# Patient Record
Sex: Female | Born: 1980 | Hispanic: No | Marital: Married | Smoking: Never smoker
Health system: Southern US, Community
[De-identification: ages and names within clinical notes are randomized; demographics above are authoritative.]

## PROBLEM LIST (undated history)

## (undated) DIAGNOSIS — K219 Gastro-esophageal reflux disease without esophagitis: Secondary | ICD-10-CM

## (undated) HISTORY — PX: TUBAL LIGATION: SHX77

---

## 2007-06-02 ENCOUNTER — Ambulatory Visit: Payer: Self-pay | Admitting: Family Medicine

## 2007-07-11 ENCOUNTER — Encounter: Payer: Self-pay | Admitting: Maternal & Fetal Medicine

## 2007-12-09 ENCOUNTER — Inpatient Hospital Stay: Payer: Self-pay

## 2008-11-01 IMAGING — CR DG CHEST 1V
1 series · 1 of 1 positions shown · non-contrast
Comparison: none

REASON FOR EXAM: Positive PPD
COMMENTS:

PROCEDURE:     DXR - DXR CHEST 1 VIEWAP OR PA  - June 02, 2007 [DATE]
RESULT:     The lungs are hyperinflated. There is no focal infiltrate. The
heart is normal in size. The pulmonary vascularity is not engorged. I do not
see objective evidence of acute or old tuberculous infection.

[view not recorded]
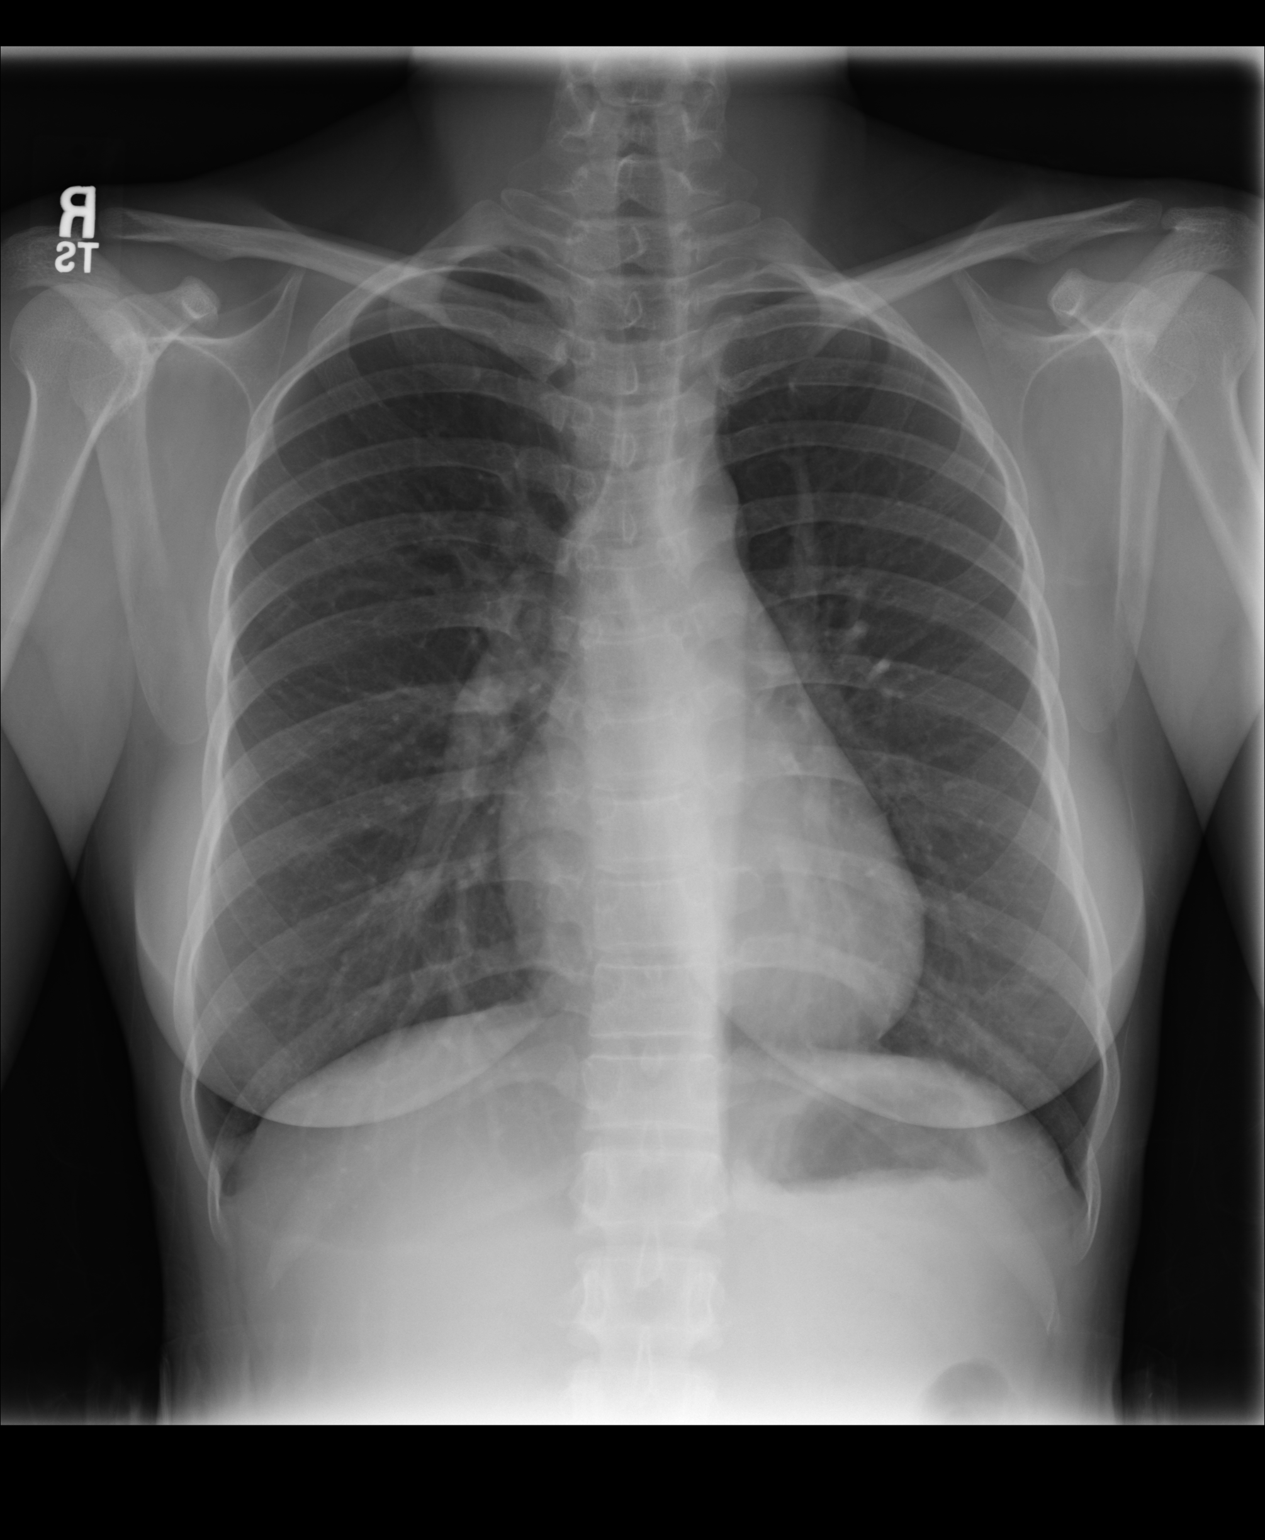

[1 of 1 positions shown; findings below may reference images not displayed]

IMPRESSION: There is mild hyperinflation which may be voluntary or
which may reflect an element of reactive airway disease. I do not see
evidence of acute or old tuberculous infection.

## 2008-12-10 IMAGING — US US OB DETAIL+14 WK - NRPT [facility]
1 series · 14 of 28 positions shown · non-contrast
Comparison: none

[Series 1: us ob detail+14 wk - nrpt [facility] · 0.27mm/px · 14 of 60 slices shown]
[im 3/60]
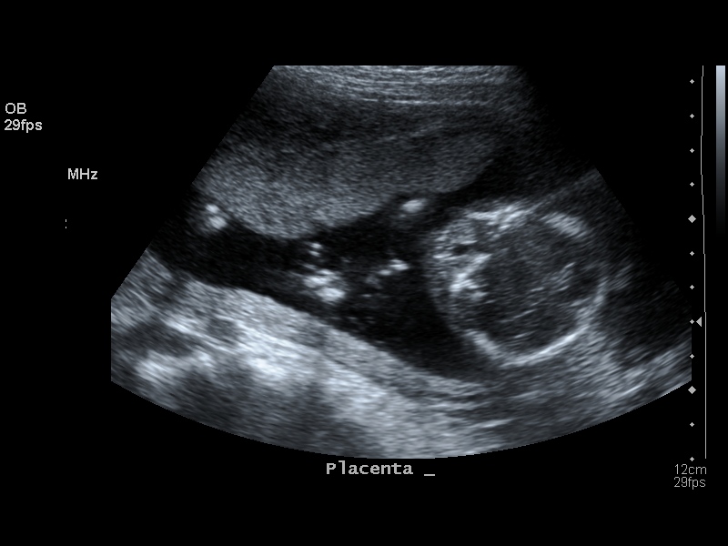
[im 7/60]
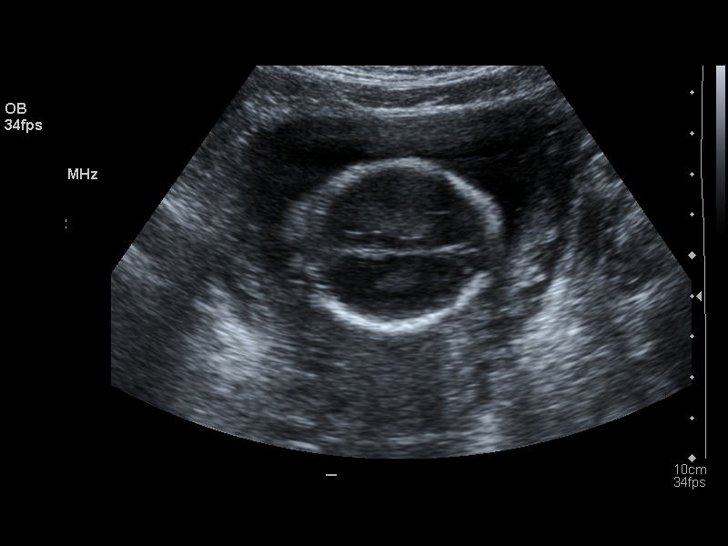
[im 11/60]
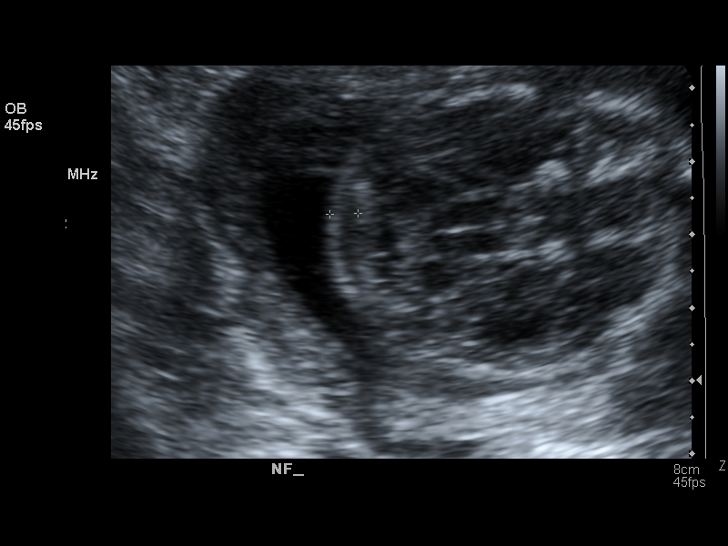
[im 16/60]
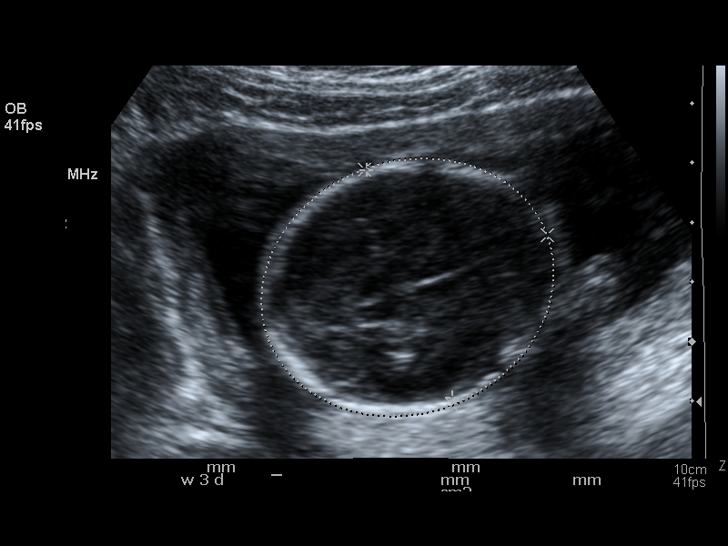
[im 20/60]
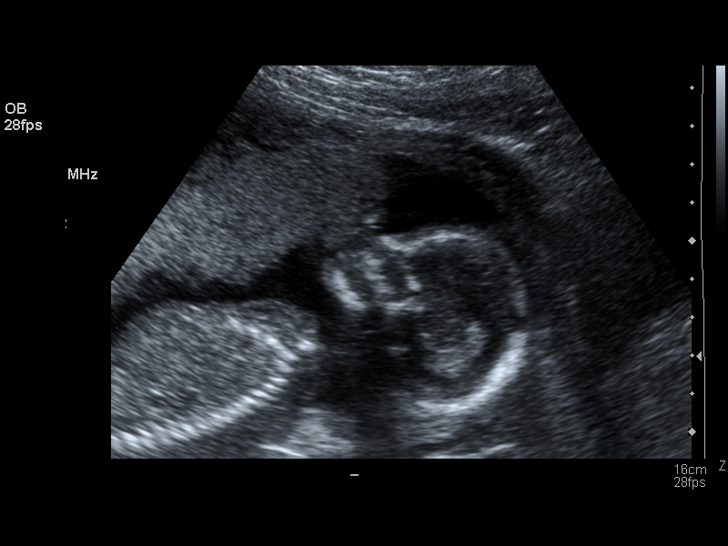
[im 25/60]
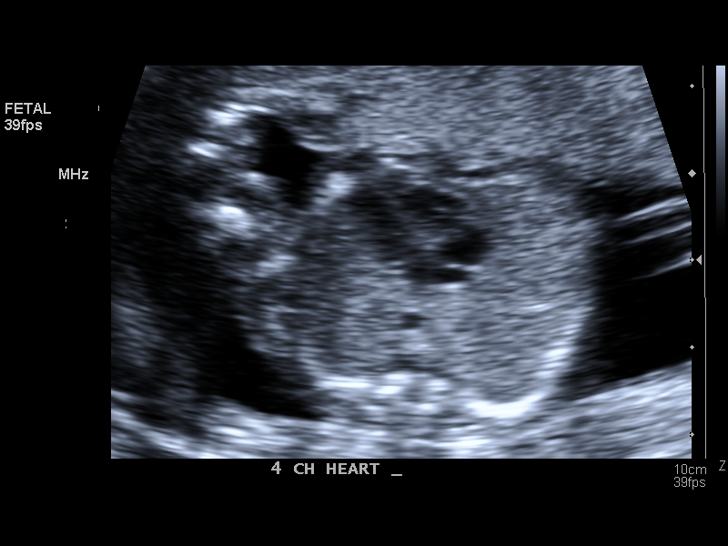
[im 29/60]
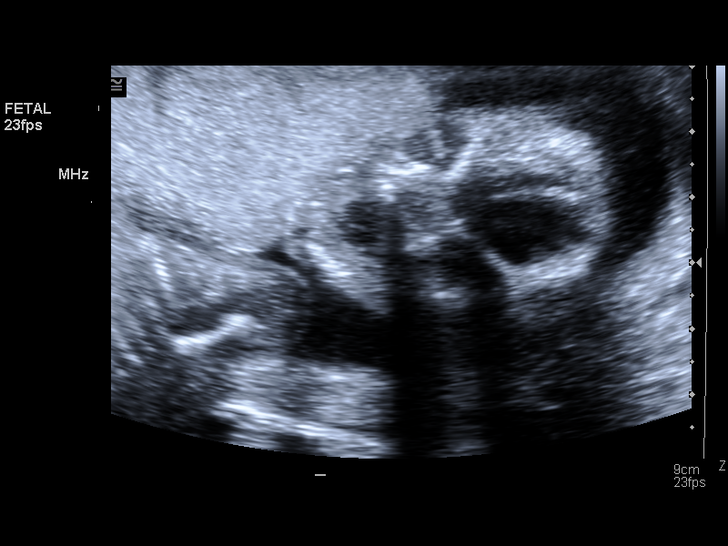
[im 33/60]
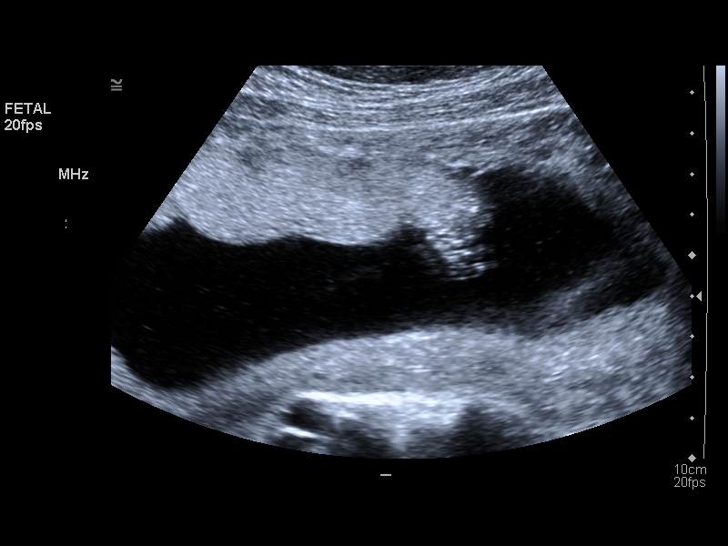
[im 38/60]
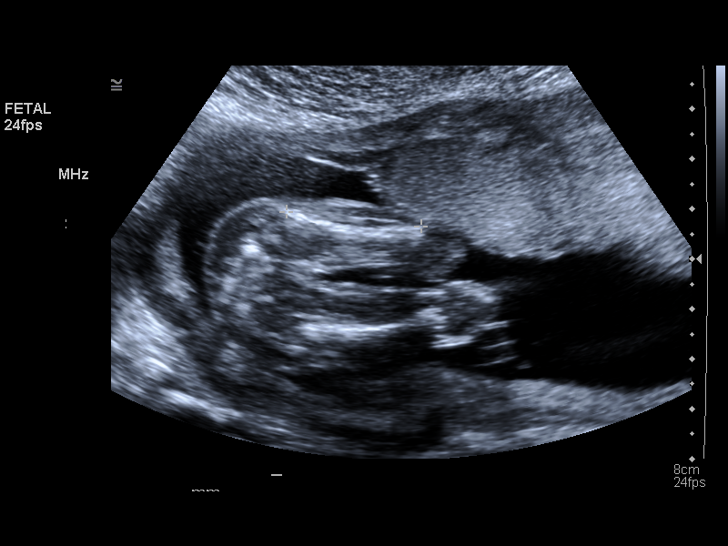
[im 42/60]
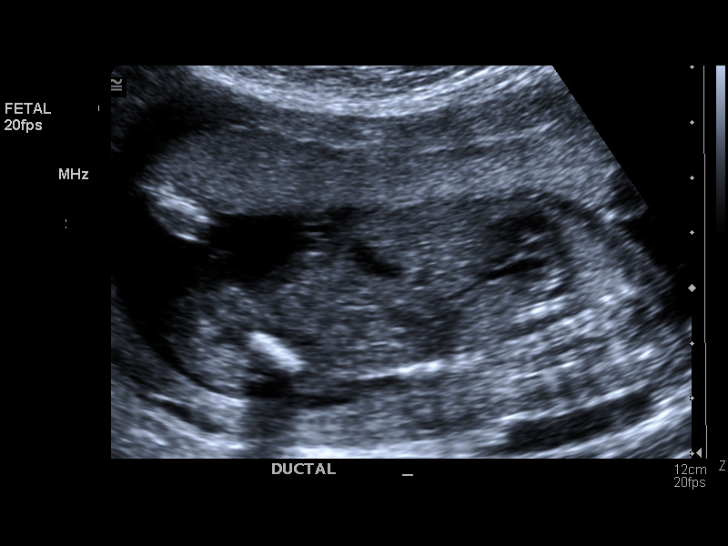
[im 46/60]
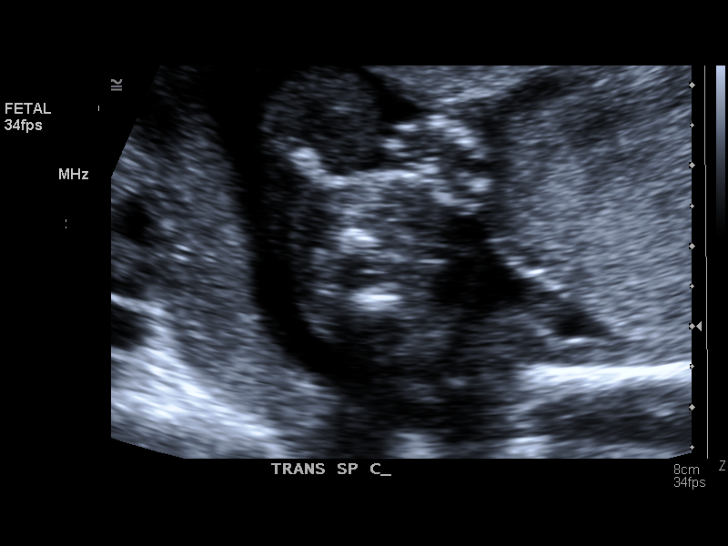
[im 51/60]
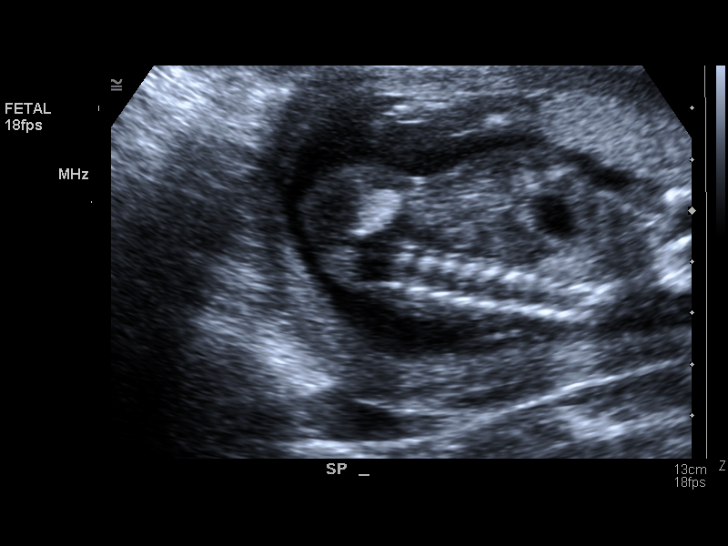
[im 55/60]
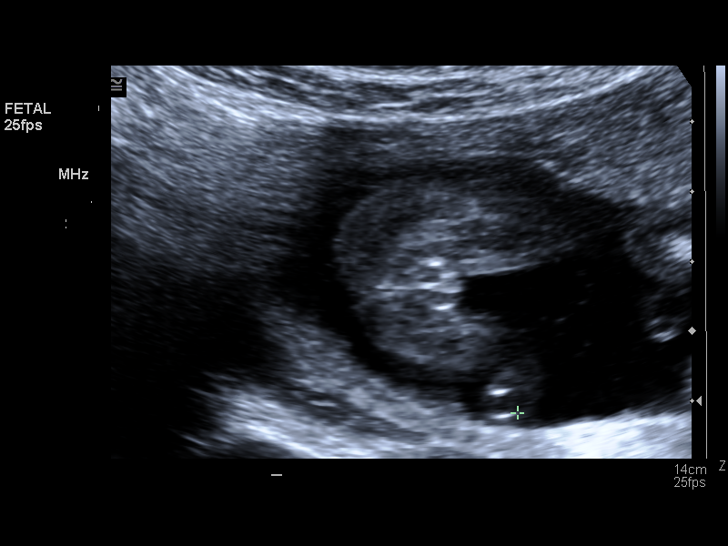
[im 60/60]
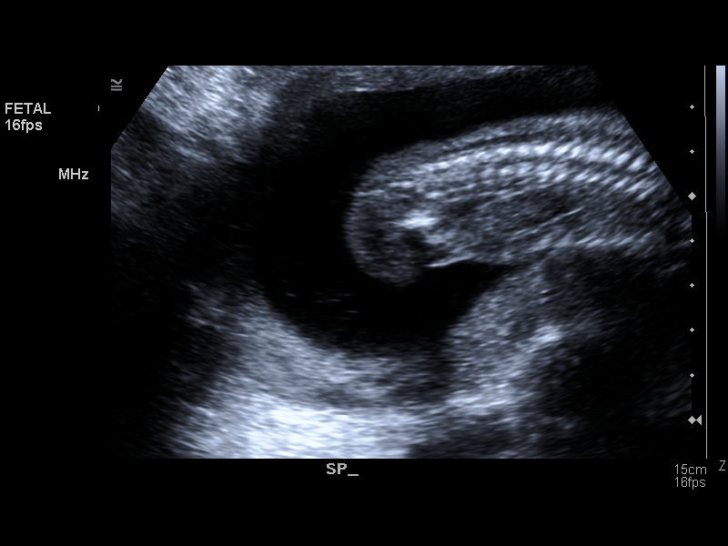

[14 of 28 positions shown; findings below may reference images not displayed]

IMAGES IMPORTED FROM THE [employer] WORKFLOW SYSTEM
NO DICTATION FOR STUDY

## 2013-08-10 ENCOUNTER — Encounter: Payer: Self-pay | Admitting: Maternal & Fetal Medicine

## 2013-09-21 ENCOUNTER — Encounter: Payer: Self-pay | Admitting: Obstetrics & Gynecology

## 2014-02-17 ENCOUNTER — Inpatient Hospital Stay: Payer: Self-pay

## 2014-02-17 LAB — CBC WITH DIFFERENTIAL/PLATELET
BASOS PCT: 0.2 %
Basophil #: 0 10*3/uL (ref 0.0–0.1)
Eosinophil #: 0.2 10*3/uL (ref 0.0–0.7)
Eosinophil %: 1.4 %
HCT: 39.3 % (ref 35.0–47.0)
HGB: 12.7 g/dL (ref 12.0–16.0)
Lymphocyte #: 1.4 10*3/uL (ref 1.0–3.6)
Lymphocyte %: 9.3 %
MCH: 29.8 pg (ref 26.0–34.0)
MCHC: 32.5 g/dL (ref 32.0–36.0)
MCV: 92 fL (ref 80–100)
MONOS PCT: 6.7 %
Monocyte #: 1 x10 3/mm — ABNORMAL HIGH (ref 0.2–0.9)
NEUTROS PCT: 82.4 %
Neutrophil #: 12.7 10*3/uL — ABNORMAL HIGH (ref 1.4–6.5)
Platelet: 117 10*3/uL — ABNORMAL LOW (ref 150–440)
RBC: 4.28 10*6/uL (ref 3.80–5.20)
RDW: 14.4 % (ref 11.5–14.5)
WBC: 15.4 10*3/uL — AB (ref 3.6–11.0)

## 2014-02-18 LAB — HEMATOCRIT: HCT: 33.2 % — ABNORMAL LOW (ref 35.0–47.0)

## 2014-02-18 LAB — CBC WITH DIFFERENTIAL/PLATELET
BASOS PCT: 0.3 %
Basophil #: 0.1 10*3/uL (ref 0.0–0.1)
Eosinophil #: 0 10*3/uL (ref 0.0–0.7)
Eosinophil %: 0.2 %
HCT: 24.3 % — AB (ref 35.0–47.0)
HGB: 7.8 g/dL — ABNORMAL LOW (ref 12.0–16.0)
Lymphocyte #: 1.4 10*3/uL (ref 1.0–3.6)
Lymphocyte %: 6.9 %
MCH: 29.6 pg (ref 26.0–34.0)
MCHC: 32.2 g/dL (ref 32.0–36.0)
MCV: 92 fL (ref 80–100)
MONOS PCT: 4.3 %
Monocyte #: 0.9 x10 3/mm (ref 0.2–0.9)
Neutrophil #: 18 10*3/uL — ABNORMAL HIGH (ref 1.4–6.5)
Neutrophil %: 88.3 %
Platelet: 195 10*3/uL (ref 150–440)
RBC: 2.64 10*6/uL — ABNORMAL LOW (ref 3.80–5.20)
RDW: 14.5 % (ref 11.5–14.5)
WBC: 20.4 10*3/uL — AB (ref 3.6–11.0)

## 2014-02-19 LAB — CBC WITH DIFFERENTIAL/PLATELET
Basophil #: 0 10*3/uL (ref 0.0–0.1)
Basophil %: 0.1 %
Eosinophil #: 0 10*3/uL (ref 0.0–0.7)
Eosinophil %: 0 %
HCT: 22.8 % — AB (ref 35.0–47.0)
HGB: 7.6 g/dL — AB (ref 12.0–16.0)
LYMPHS ABS: 1.7 10*3/uL (ref 1.0–3.6)
LYMPHS PCT: 9.5 %
MCH: 29.7 pg (ref 26.0–34.0)
MCHC: 33.3 g/dL (ref 32.0–36.0)
MCV: 89 fL (ref 80–100)
MONO ABS: 1.5 x10 3/mm — AB (ref 0.2–0.9)
MONOS PCT: 8.3 %
NEUTROS ABS: 14.8 10*3/uL — AB (ref 1.4–6.5)
Neutrophil %: 82.1 %
PLATELETS: 148 10*3/uL — AB (ref 150–440)
RBC: 2.55 10*6/uL — AB (ref 3.80–5.20)
RDW: 14.6 % — ABNORMAL HIGH (ref 11.5–14.5)
WBC: 18.1 10*3/uL — ABNORMAL HIGH (ref 3.6–11.0)

## 2014-02-19 LAB — HEMATOCRIT: HCT: 23.8 % — ABNORMAL LOW (ref 35.0–47.0)

## 2014-02-20 LAB — CBC WITH DIFFERENTIAL/PLATELET
Basophil #: 0 10*3/uL (ref 0.0–0.1)
Basophil %: 0.3 %
Eosinophil #: 0.3 10*3/uL (ref 0.0–0.7)
Eosinophil %: 2.4 %
HCT: 19.4 % — ABNORMAL LOW (ref 35.0–47.0)
HGB: 6.5 g/dL — ABNORMAL LOW (ref 12.0–16.0)
LYMPHS PCT: 11.2 %
Lymphocyte #: 1.4 10*3/uL (ref 1.0–3.6)
MCH: 30.2 pg (ref 26.0–34.0)
MCHC: 33.3 g/dL (ref 32.0–36.0)
MCV: 91 fL (ref 80–100)
MONOS PCT: 9.5 %
Monocyte #: 1.1 x10 3/mm — ABNORMAL HIGH (ref 0.2–0.9)
Neutrophil #: 9.2 10*3/uL — ABNORMAL HIGH (ref 1.4–6.5)
Neutrophil %: 76.6 %
Platelet: 122 10*3/uL — ABNORMAL LOW (ref 150–440)
RBC: 2.14 10*6/uL — ABNORMAL LOW (ref 3.80–5.20)
RDW: 14.7 % — ABNORMAL HIGH (ref 11.5–14.5)
WBC: 12 10*3/uL — AB (ref 3.6–11.0)

## 2014-02-20 LAB — HEMATOCRIT: HCT: 25.9 % — AB (ref 35.0–47.0)

## 2014-02-21 LAB — CBC WITH DIFFERENTIAL/PLATELET
BASOS PCT: 0.4 %
Basophil #: 0 10*3/uL (ref 0.0–0.1)
Basophil #: 0 10*3/uL (ref 0.0–0.1)
Basophil %: 0.2 %
EOS PCT: 5.6 %
Eosinophil #: 0.5 10*3/uL (ref 0.0–0.7)
Eosinophil #: 0.7 10*3/uL (ref 0.0–0.7)
Eosinophil %: 6.3 %
HCT: 23.9 % — ABNORMAL LOW (ref 35.0–47.0)
HCT: 25.1 % — AB (ref 35.0–47.0)
HGB: 8.1 g/dL — ABNORMAL LOW (ref 12.0–16.0)
HGB: 8.2 g/dL — AB (ref 12.0–16.0)
Lymphocyte #: 1.5 10*3/uL (ref 1.0–3.6)
Lymphocyte #: 2.1 10*3/uL (ref 1.0–3.6)
Lymphocyte %: 15.6 %
Lymphocyte %: 19.8 %
MCH: 29.4 pg (ref 26.0–34.0)
MCH: 28.7 pg (ref 26.0–34.0)
MCHC: 33.7 g/dL (ref 32.0–36.0)
MCHC: 32.6 g/dL (ref 32.0–36.0)
MCV: 87 fL (ref 80–100)
MCV: 88 fL (ref 80–100)
MONO ABS: 0.6 x10 3/mm (ref 0.2–0.9)
MONOS PCT: 6.3 %
Monocyte #: 0.9 x10 3/mm (ref 0.2–0.9)
Monocyte %: 8 %
NEUTROS ABS: 7 10*3/uL — AB (ref 1.4–6.5)
Neutrophil #: 6.9 10*3/uL — ABNORMAL HIGH (ref 1.4–6.5)
Neutrophil %: 72.3 %
Neutrophil %: 65.5 %
PLATELETS: 145 10*3/uL — AB (ref 150–440)
Platelet: 124 10*3/uL — ABNORMAL LOW (ref 150–440)
RBC: 2.74 10*6/uL — ABNORMAL LOW (ref 3.80–5.20)
RBC: 2.84 10*6/uL — ABNORMAL LOW (ref 3.80–5.20)
RDW: 18.5 % — AB (ref 11.5–14.5)
RDW: 19.2 % — ABNORMAL HIGH (ref 11.5–14.5)
WBC: 9.5 10*3/uL (ref 3.6–11.0)
WBC: 10.7 10*3/uL (ref 3.6–11.0)

## 2014-02-21 LAB — PATHOLOGY REPORT

## 2014-02-22 LAB — CBC WITH DIFFERENTIAL/PLATELET
Basophil #: 0 10*3/uL (ref 0.0–0.1)
Basophil %: 0.4 %
EOS ABS: 0.6 10*3/uL (ref 0.0–0.7)
Eosinophil %: 7.5 %
HCT: 24.7 % — ABNORMAL LOW (ref 35.0–47.0)
HGB: 8 g/dL — ABNORMAL LOW (ref 12.0–16.0)
Lymphocyte #: 1.7 10*3/uL (ref 1.0–3.6)
Lymphocyte %: 20.6 %
MCH: 28.7 pg (ref 26.0–34.0)
MCHC: 32.4 g/dL (ref 32.0–36.0)
MCV: 89 fL (ref 80–100)
Monocyte #: 0.7 x10 3/mm (ref 0.2–0.9)
Monocyte %: 8.4 %
NEUTROS ABS: 5.3 10*3/uL (ref 1.4–6.5)
Neutrophil %: 63.1 %
Platelet: 131 10*3/uL — ABNORMAL LOW (ref 150–440)
RBC: 2.79 10*6/uL — AB (ref 3.80–5.20)
RDW: 18.5 % — ABNORMAL HIGH (ref 11.5–14.5)
WBC: 8.3 10*3/uL (ref 3.6–11.0)

## 2014-09-08 NOTE — Op Note (Signed)
PATIENT NAME:  Alicia Erickson, Alicia Erickson MR#:  [id number] DATE OF BIRTH:  1981-04-20  DATE OF PROCEDURE:  02/18/2014  PREOPERATIVE DIAGNOSIS: Desire for permanent sterility.  POSTOPERATIVE DIAGNOSIS: Desire for permanent sterility.    PROCEDURE: Postpartum bilateral tubal ligation.   SURGEON: Glean Salen, M.D.  ANESTHESIA: General.   ESTIMATED BLOOD LOSS: Minimal.   COMPLICATIONS: None.   FINDINGS: Normal fallopian tubes on a post gravid uterus.   DISPOSITION: To the recovery room in stable condition.   TECHNIQUE: The patient was prepped and draped in the usual sterile fashion after adequate anesthesia was obtained in the supine position on the operating room table. Marcaine half percent was used to anesthetize the skin just inferior to the umbilicus followed by a small skin incision using a scalpel. The rectus fascia was identified, tented anteriorly and then incised using Metzenbaum scissors. There were no palpable adhesions or bowel in this vicinity. Retractors were placed and the patient was placed in Trendelenburg positioning.   The right fallopian tube was identified and grasped with a clamp once the  fimbria was visualized and then a midportion of the tube was tied with 2 Vicryl sutures, excised and cauterized. The left fallopian tube was grasped in the midportion with Balfour clamp after the fimbria was visualized and a loop was tied with 2 Vicryl sutures, excised and cauterized.  Specimen was sent to pathology for further review.   The patient was leveled.  The rectus fascia was closed with a 0-Vicryl suture. Subcutaneous tissues were irrigated and hemostasis was assured with electrocautery. Subcutaneous suture was placed to approximate the tissues and Dermabond was used to close the skin. The patient went to the recovery room in stable condition. All sponge, instrument and needle counts were correct.    ____________________________ R. Barnett Applebaum, MD rph:hh D: 02/18/2014 16:20:00  ET T: 02/18/2014 20:57:16 ET JOB#: [id number]  cc: Glean Salen, MD, <Dictator> Gae Dry MD ELECTRONICALLY SIGNED 02/19/2014 7:56

## 2014-09-08 NOTE — Op Note (Signed)
PATIENT NAME:  Alicia Erickson, GAUSE MR#:  [id number] DATE OF BIRTH:  Nov 16, 1980  DATE OF PROCEDURE:  02/18/2014  PREOPERATIVE DIAGNOSIS:  Postoperative bleeding.   POSTOPERATIVE DIAGNOSIS: Postoperative bleeding.   PROCEDURE: Exploratory laparotomy.   SURGEON: Glean Salen, M.D.   ANESTHESIA: General.   ESTIMATED BLOOD LOSS: 2400 mL of blood clot and blood were aspirated.   COMPLICATIONS: None.   FINDINGS: Tubal sites were both clean, dry, and intact without bleeding in these areas. Bleeding was visualized and determined to be underneath the umbilicus at the peritoneal and/or fascial edges. No other bleeding was visualized throughout small bowel, colon, omentum, uterus, side walls and liver.  DISPOSITION: To the recovery room in stable condition.   TECHNIQUE: The patient was prepped and draped in the usual sterile fashion after adequate anesthesia is obtained in the supine position on the operating room table. Foley catheter has been placed. Scalpel was used to create a midline vertical incision beneath the umbilicus not including the prior scar in this area. The incision was carried down to the level of the rectus fascia, which was then dissected superiorly and inferiorly and the rectus muscle separated in the midline. Peritoneum was penetrated and exploration was performed. Aspiration of blood clot and blood is performed, which takes several minutes due to several blood clots in her abdominal cavity. Once the blood was aspirated dural exploration of the abdominal cavity and pelvic cavity was performed. The tubal sites, in particular were examined closely with no bleeding noted from these areas. The uterus was appropriately sized postpartum without any other signs of bleeding in other areas around the uterus. The small bowel, colon, liver and other structures are visualized as non-bleeding.  At looking at the peritoneum underneath the umbilicus there is bleeding in this area and 4 sutures were  placed to oversew this area where bleeding was and could still be occurring. At this time the patient was observed for any further bleeding in any parts of the intra-abdominal cavity and it appears to be dry and hemostatic in all areas without any further pooling or welling of blood.  Examination of where we put the sutures reveals no complications in this area and no further bleeding.   The rectus fascia was closed with a 0 PDS suture. Subcutaneous tissues were irrigated and hemostasis was assured using electrocautery. Skin was closed with surgical clips. The patient goes to the recovery room in stable condition. All sponge, instrument, and needle counts were correct at the conclusion of the case. Bandage was applied to the incision before going to the recovery room.    ____________________________ R. Barnett Applebaum, MD rph:hh D: 02/18/2014 37:48:27 ET T: 02/18/2014 23:25:21 ET JOB#: [id number]  cc: Glean Salen, MD, <Dictator> Gae Dry MD ELECTRONICALLY SIGNED 02/19/2014 7:55

## 2014-09-25 NOTE — H&P (Signed)
L&D Evaluation:  History:  HPI 34 y/o G3P2002 @ 41wks [place] 02/10/14 arrived for scheduled IOL in active labor. Regular contractions, denies leaking fluid, nl show, baby active. Care @ [facility] AMA GBS+   Presents with contractions   Patient's Medical History No Chronic Illness   Patient's Surgical History none   Medications Pre Natal Vitamins   Allergies NKDA   Social History none   Family History Non-Contributory   ROS:  ROS All systems were reviewed.  HEENT, CNS, GI, GU, Respiratory, CV, Renal and Musculoskeletal systems were found to be normal.   Exam:  Vital Signs stable   Urine Protein not completed   General no apparent distress   Mental Status clear   Chest clear   Heart normal sinus rhythm   Abdomen gravid, non-tender   Estimated Fetal Weight Average for gestational age   Fetal Position vtx   Fundal Height term   Back no CVAT   Edema no edema   Reflexes 1+   Clonus negative   Pelvic no external lesions, 6cm upon admission rapid progress to complete   Mebranes Ruptured   Description green/meconium   FHT normal rate with no decels, baseline 130's 140's avg variability with accels   FHT Description 136   Ucx regular   Ucx Frequency 2 min   Skin dry   Lymph no lymphadenopathy   Impression:  Impression active labor   Plan:  Plan EFM/NST, monitor contractions and for cervical change, antibiotics for GBBS prophylaxis   Comments Admitted, knows what to expect 3rd baby. Husband at bedside, supportive. IV ABX begun. NEO notified moderate MSAF.   Electronic Signatures: Rosie Fate (CNM)  (Signed 03-Oct-15 09:39)  Authored: L&D Evaluation   Last Updated: 03-Oct-15 09:39 by Rosie Fate (CNM)

## 2016-09-16 ENCOUNTER — Telehealth: Payer: Self-pay | Admitting: *Deleted

## 2016-09-16 ENCOUNTER — Encounter: Payer: Self-pay | Admitting: *Deleted

## 2016-09-16 NOTE — Telephone Encounter (Signed)
Left message for patient to call the office to schedule an appointment. Referred by Dr. Leonides Schanz re: Lipoma on  Back. Notes in folder.

## 2016-09-21 ENCOUNTER — Encounter: Payer: Self-pay | Admitting: *Deleted

## 2016-09-24 ENCOUNTER — Ambulatory Visit (INDEPENDENT_AMBULATORY_CARE_PROVIDER_SITE_OTHER): Payer: 59 | Admitting: General Surgery

## 2016-09-24 ENCOUNTER — Encounter: Payer: Self-pay | Admitting: General Surgery

## 2016-09-24 VITALS — BP 110/68 | HR 72 | Resp 12 | Ht 60.0 in | Wt 109.0 lb

## 2016-09-24 DIAGNOSIS — D171 Benign lipomatous neoplasm of skin and subcutaneous tissue of trunk: Secondary | ICD-10-CM | POA: Diagnosis not present

## 2016-09-24 NOTE — Progress Notes (Signed)
Patient ID: Alicia Erickson, female   DOB: 31-Aug-1980, 36 y.o.   MRN: [id number]  Chief Complaint  Patient presents with  . Lipoma    HPI [name] is a 36 y.o. female here today for a evaluation of a lipoma on her back. Patient states she noticed this area about 13 years ago. She states the area is getting bigger and is sometimes painful.  HPI  No past medical history on file.  Past Surgical History:  Procedure Laterality Date  . TUBAL LIGATION      No family history on file.  Social History Social History  Substance Use Topics  . Smoking status: Never Smoker  . Smokeless tobacco: Never Used  . Alcohol use No    No Known Allergies  No current outpatient prescriptions on file.   No current facility-administered medications for this visit.     Review of Systems Review of Systems  Constitutional: Negative.   Respiratory: Negative.   Cardiovascular: Negative.     Blood pressure 110/68, pulse 72, resp. rate 12, height 5' (1.524 m), weight 109 lb (49.4 kg), last menstrual period 09/04/2016.  Physical Exam Physical Exam  Constitutional: She is oriented to person, place, and time. She appears well-developed and well-nourished.  Eyes: Conjunctivae are normal. No scleral icterus.  Neck: Neck supple.  Cardiovascular: Normal rate, regular rhythm and normal heart sounds.   Pulmonary/Chest: Effort normal and breath sounds normal.  Lymphadenopathy:    She has no cervical adenopathy.  Neurological: She is alert and oriented to person, place, and time.  Skin: Skin is warm and dry.       Data Reviewed History reviewed   Assessment    5cm soft, mobile, subcutaneous mass on left lower back, consistent with lipoma. Considering size and location of the mass, may be more appropriate to have it excised in the OR. Discussed details of procedure with the patient and she was agreeable.    Plan    Patient to be scheduled for excision lipoma lower back.      HPI,  Physical Exam, Assessment and Plan have been scribed under the direction and in the presence of Mckinley Jewel, MD  Gaspar Cola, CMA  I have completed the exam and reviewed the above documentation for accuracy and completeness.  I agree with the above.  Haematologist has been used and any errors in dictation or transcription are unintentional.  Judyann Casasola G. Jamal Collin, M.D., F.A.C.S.  Junie Panning G 09/24/2016, 9:56 AM   Patient's surgery has been scheduled for 10-01-16 at [facility].   Dominga Ferry, CMA

## 2016-09-24 NOTE — Patient Instructions (Signed)
Lipoma A lipoma is a noncancerous (benign) tumor that is made up of fat cells. This is a very common type of soft-tissue growth. Lipomas are usually found under the skin (subcutaneous). They may occur in any tissue of the body that contains fat. Common areas for lipomas to appear include the back, shoulders, buttocks, and thighs. Lipomas grow slowly, and they are usually painless. Most lipomas do not cause problems and do not require treatment. What are the causes? The cause of this condition is not known. What increases the risk? This condition is more likely to develop in:  People who are 80-39 years old.  People who have a family history of lipomas. What are the signs or symptoms? A lipoma usually appears as a small, round bump under the skin. It may feel soft or rubbery, but the firmness can vary. Most lipomas are not painful. However, a lipoma may become painful if it is located in an area where it pushes on nerves. How is this diagnosed? A lipoma can usually be diagnosed with a physical exam. You may also have tests to confirm the diagnosis and to rule out other conditions. Tests may include:  Imaging tests, such as a CT scan or MRI.  Removal of a tissue sample to be looked at under a microscope (biopsy). How is this treated? Treatment is not needed for small lipomas that are not causing problems. If a lipoma continues to get bigger or it causes problems, removal is often the best option. Lipomas can also be removed to improve appearance. Removal of a lipoma is usually done with a surgery in which the fatty cells and the surrounding capsule are removed. Most often, a medicine that numbs the area (local anesthetic) is used for this procedure. Follow these instructions at home:  Keep all follow-up visits as directed by your health care provider. This is important. Contact a health care provider if:  Your lipoma becomes larger or hard.  Your lipoma becomes painful, red, or increasingly  swollen. These could be signs of infection or a more serious condition. This information is not intended to replace advice given to you by your health care provider. Make sure you discuss any questions you have with your health care provider. Document Released: 04/24/2002 Document Revised: 10/10/2015 Document Reviewed: 04/30/2014 Elsevier Interactive Patient Education  2017 [employer].

## 2016-09-28 ENCOUNTER — Encounter: Payer: Self-pay | Admitting: *Deleted

## 2016-09-28 ENCOUNTER — Encounter
Admission: RE | Admit: 2016-09-28 | Discharge: 2016-09-28 | Disposition: A | Discharge status: Home / Self Care | Payer: 59 | Source: Ambulatory Visit | Attending: General Surgery | Admitting: General Surgery

## 2016-09-28 HISTORY — PX: WISDOM TOOTH EXTRACTION: SHX21

## 2016-09-28 HISTORY — DX: Gastro-esophageal reflux disease without esophagitis: K21.9

## 2016-09-28 NOTE — Patient Instructions (Signed)
  Your procedure is scheduled on: 10-01-16 Report to Same Day Surgery 2nd floor medical mall [facility] Entrance-take elevator on left to 2nd floor.  Check in with surgery information desk.) To find out your arrival time please call [phone] between 1PM - 3PM on 09-30-16  Remember: Instructions that are not followed completely may result in serious medical risk, up to and including death, or upon the discretion of your surgeon and anesthesiologist your surgery may need to be rescheduled.    _x___ 1. Do not eat food or drink liquids after midnight. No gum chewing or  hard candies.     __x__ 2. No Alcohol for 24 hours before or after surgery.   __x__3. No Smoking for 24 prior to surgery.   ____  4. Bring all medications with you on the day of surgery if instructed.    __x__ 5. Notify your doctor if there is any change in your medical condition     (cold, fever, infections).     Do not wear jewelry, make-up, hairpins, clips or nail polish.  Do not wear lotions, powders, or perfumes. You may wear deodorant.  Do not shave 48 hours prior to surgery. Men may shave face and neck.  Do not bring valuables to the hospital.    [facility] is not responsible for any belongings or valuables.               Contacts, dentures or bridgework may not be worn into surgery.  Leave your suitcase in the car. After surgery it may be brought to your room.  For patients admitted to the hospital, discharge time is determined by your  treatment team.   Patients discharged the day of surgery will not be allowed to drive home.  You will need someone to drive you home and stay with you the night of your procedure.    Please read over the following fact sheets that you were given:    ____ Take anti-hypertensive (unless it includes a diuretic), cardiac, seizure, asthma,     anti-reflux and psychiatric medicines. These include:  1. NONE  2.  3.  4.  5.  6.  ____Fleets enema or Magnesium Citrate as  directed.   ____ Use CHG Soap or sage wipes as directed on instruction sheet   ____ Use inhalers on the day of surgery and bring to hospital day of surgery  ____ Stop Metformin and Janumet 2 days prior to surgery.    ____ Take 1/2 of usual insulin dose the night before surgery and none on the morning     surgery.   ____ Follow recommendations from Cardiologist, Pulmonologist or PCP regarding stopping Aspirin, Coumadin, Pllavix ,Eliquis, Effient, or Pradaxa, and Pletal.  X____Stop Anti-inflammatories such as Advil, Aleve, IBUPROFEN, Motrin, Naproxen, Naprosyn, Goodies powders or aspirin products AFTER TODAY (09-28-16)-OK to take Tylenol    ____ Stop supplements until after surgery.     ____ Bring C-Pap to the hospital.

## 2016-10-01 ENCOUNTER — Ambulatory Visit: Payer: 59 | Admitting: Anesthesiology

## 2016-10-01 ENCOUNTER — Encounter
Admission: RE | Disposition: A | Discharge status: Home / Self Care | Payer: Self-pay | Source: Ambulatory Visit | Attending: General Surgery

## 2016-10-01 ENCOUNTER — Encounter: Payer: Self-pay | Admitting: *Deleted

## 2016-10-01 ENCOUNTER — Ambulatory Visit
Admission: RE | Admit: 2016-10-01 | Discharge: 2016-10-01 | Disposition: A | Discharge status: Home / Self Care | Payer: 59 | Source: Ambulatory Visit | Attending: General Surgery | Admitting: General Surgery

## 2016-10-01 DIAGNOSIS — K219 Gastro-esophageal reflux disease without esophagitis: Secondary | ICD-10-CM | POA: Diagnosis not present

## 2016-10-01 DIAGNOSIS — D171 Benign lipomatous neoplasm of skin and subcutaneous tissue of trunk: Secondary | ICD-10-CM | POA: Diagnosis present

## 2016-10-01 DIAGNOSIS — Z9851 Tubal ligation status: Secondary | ICD-10-CM | POA: Diagnosis not present

## 2016-10-01 HISTORY — PX: LIPOMA EXCISION: SHX5283

## 2016-10-01 LAB — POCT PREGNANCY, URINE: Preg Test, Ur: NEGATIVE

## 2016-10-01 SURGERY — EXCISION LIPOMA
Anesthesia: General | Laterality: Left | Wound class: Clean

## 2016-10-01 MED ORDER — MIDAZOLAM HCL 2 MG/2ML IJ SOLN
INTRAMUSCULAR | Status: AC
  Filled 2016-10-01: qty 2

## 2016-10-01 MED ORDER — FAMOTIDINE 20 MG PO TABS
20.0000 mg | ORAL_TABLET | Freq: Once | ORAL | Status: AC
  Administered 2016-10-01: 20 mg via ORAL

## 2016-10-01 MED ORDER — ROCURONIUM BROMIDE 100 MG/10ML IV SOLN
INTRAVENOUS | Status: DC | PRN
  Administered 2016-10-01: 5 mg via INTRAVENOUS

## 2016-10-01 MED ORDER — FENTANYL CITRATE (PF) 100 MCG/2ML IJ SOLN
INTRAMUSCULAR | Status: AC
  Filled 2016-10-01: qty 2

## 2016-10-01 MED ORDER — LACTATED RINGERS IV SOLN
INTRAVENOUS | Status: DC
  Administered 2016-10-01: 07:00:00 via INTRAVENOUS

## 2016-10-01 MED ORDER — KETOROLAC TROMETHAMINE 30 MG/ML IJ SOLN
INTRAMUSCULAR | Status: DC | PRN
  Administered 2016-10-01: 30 mg via INTRAVENOUS

## 2016-10-01 MED ORDER — ONDANSETRON HCL 4 MG/2ML IJ SOLN: 4.0000 mg | Freq: Once | INTRAMUSCULAR | Status: DC | PRN

## 2016-10-01 MED ORDER — ONDANSETRON HCL 4 MG/2ML IJ SOLN
INTRAMUSCULAR | Status: DC | PRN
  Administered 2016-10-01: 4 mg via INTRAVENOUS

## 2016-10-01 MED ORDER — FENTANYL CITRATE (PF) 100 MCG/2ML IJ SOLN
INTRAMUSCULAR | Status: DC | PRN
  Administered 2016-10-01: 100 ug via INTRAVENOUS

## 2016-10-01 MED ORDER — PROPOFOL 10 MG/ML IV BOLUS
INTRAVENOUS | Status: DC | PRN
  Administered 2016-10-01: 120 mg via INTRAVENOUS

## 2016-10-01 MED ORDER — BUPIVACAINE HCL (PF) 0.5 % IJ SOLN
INTRAMUSCULAR | Status: DC | PRN
Start: 2016-10-01 — End: 2016-10-01
  Administered 2016-10-01: 20 mL

## 2016-10-01 MED ORDER — MIDAZOLAM HCL 2 MG/2ML IJ SOLN
INTRAMUSCULAR | Status: DC | PRN
  Administered 2016-10-01: 2 mg via INTRAVENOUS

## 2016-10-01 MED ORDER — FAMOTIDINE 20 MG PO TABS
ORAL_TABLET | ORAL | Status: AC
  Administered 2016-10-01: 20 mg via ORAL
  Filled 2016-10-01: qty 1

## 2016-10-01 MED ORDER — TRAMADOL HCL 50 MG PO TABS: 50.0000 mg | ORAL_TABLET | Freq: Four times a day (QID) | ORAL | 0 refills | Status: AC | PRN

## 2016-10-01 MED ORDER — LIDOCAINE-EPINEPHRINE 1 %-1:100000 IJ SOLN
INTRAMUSCULAR | Status: AC
  Filled 2016-10-01: qty 1

## 2016-10-01 MED ORDER — SUCCINYLCHOLINE CHLORIDE 20 MG/ML IJ SOLN
INTRAMUSCULAR | Status: DC | PRN
  Administered 2016-10-01: 70 mg via INTRAVENOUS

## 2016-10-01 MED ORDER — DEXAMETHASONE SODIUM PHOSPHATE 10 MG/ML IJ SOLN
INTRAMUSCULAR | Status: DC | PRN
  Administered 2016-10-01: 5 mg via INTRAVENOUS

## 2016-10-01 MED ORDER — FENTANYL CITRATE (PF) 100 MCG/2ML IJ SOLN: 25.0000 ug | INTRAMUSCULAR | Status: DC | PRN

## 2016-10-01 MED ORDER — BUPIVACAINE HCL (PF) 0.5 % IJ SOLN
INTRAMUSCULAR | Status: AC
  Filled 2016-10-01: qty 30

## 2016-10-01 MED ORDER — PROPOFOL 10 MG/ML IV BOLUS
INTRAVENOUS | Status: AC
  Filled 2016-10-01: qty 20

## 2016-10-01 MED ORDER — CHLORHEXIDINE GLUCONATE CLOTH 2 % EX PADS
6.0000 | MEDICATED_PAD | Freq: Once | CUTANEOUS | Status: AC
  Administered 2016-10-01: 6 via TOPICAL

## 2016-10-01 MED ORDER — LIDOCAINE HCL (CARDIAC) 20 MG/ML IV SOLN
INTRAVENOUS | Status: DC | PRN
  Administered 2016-10-01: 40 mg via INTRAVENOUS

## 2016-10-01 SURGICAL SUPPLY — 25 items
CANISTER SUCT 1200ML W/VALVE (MISCELLANEOUS) ×3 IMPLANT
CHLORAPREP W/TINT 26ML (MISCELLANEOUS) ×3 IMPLANT
CLOSURE WOUND 1/2 X4 (GAUZE/BANDAGES/DRESSINGS)
DERMABOND ADVANCED (GAUZE/BANDAGES/DRESSINGS) ×2
DERMABOND ADVANCED .7 DNX12 (GAUZE/BANDAGES/DRESSINGS) ×1 IMPLANT
DRAPE LAPAROTOMY 100X77 ABD (DRAPES) ×3 IMPLANT
ELECT REM PT RETURN 9FT ADLT (ELECTROSURGICAL) ×3
ELECTRODE REM PT RTRN 9FT ADLT (ELECTROSURGICAL) ×1 IMPLANT
GLOVE BIO SURGEON STRL SZ7 (GLOVE) ×9 IMPLANT
GOWN STRL REUS W/ TWL LRG LVL3 (GOWN DISPOSABLE) ×2 IMPLANT
GOWN STRL REUS W/TWL LRG LVL3 (GOWN DISPOSABLE) ×4
KIT RM TURNOVER STRD PROC AR (KITS) ×3 IMPLANT
LABEL OR SOLS (LABEL) ×3 IMPLANT
NEEDLE HYPO 25X1 1.5 SAFETY (NEEDLE) ×3 IMPLANT
PACK BASIN MINOR ARMC (MISCELLANEOUS) ×3 IMPLANT
STRIP CLOSURE SKIN 1/2X4 (GAUZE/BANDAGES/DRESSINGS) IMPLANT
SUT ETHILON 4-0 (SUTURE)
SUT ETHILON 4-0 FS2 18XMFL BLK (SUTURE)
SUT VIC AB 2-0 CT1 (SUTURE) ×3 IMPLANT
SUT VIC AB 3-0 SH 27 (SUTURE) ×2
SUT VIC AB 3-0 SH 27X BRD (SUTURE) ×1 IMPLANT
SUT VIC AB 4-0 FS2 27 (SUTURE) ×3 IMPLANT
SUT VICRYL+ 3-0 144IN (SUTURE) IMPLANT
SUTURE ETHLN 4-0 FS2 18XMF BLK (SUTURE) IMPLANT
SYR CONTROL 10ML (SYRINGE) ×3 IMPLANT

## 2016-10-01 NOTE — H&P (View-Only) (Signed)
Patient ID: Alicia Erickson, female   DOB: May 09, 1981, 36 y.o.   MRN: [id number]  Chief Complaint  Patient presents with  . Lipoma    HPI Alicia Erickson is a 36 y.o. female here today for a evaluation of a lipoma on her back. Patient states she noticed this area about 13 years ago. She states the area is getting bigger and is sometimes painful.  HPI  No past medical history on file.  Past Surgical History:  Procedure Laterality Date  . TUBAL LIGATION      No family history on file.  Social History Social History  Substance Use Topics  . Smoking status: Never Smoker  . Smokeless tobacco: Never Used  . Alcohol use No    No Known Allergies  No current outpatient prescriptions on file.   No current facility-administered medications for this visit.     Review of Systems Review of Systems  Constitutional: Negative.   Respiratory: Negative.   Cardiovascular: Negative.     Blood pressure 110/68, pulse 72, resp. rate 12, height 5' (1.524 m), weight 109 lb (49.4 kg), last menstrual period 09/04/2016.  Physical Exam Physical Exam  Constitutional: She is oriented to person, place, and time. She appears well-developed and well-nourished.  Eyes: Conjunctivae are normal. No scleral icterus.  Neck: Neck supple.  Cardiovascular: Normal rate, regular rhythm and normal heart sounds.   Pulmonary/Chest: Effort normal and breath sounds normal.  Lymphadenopathy:    She has no cervical adenopathy.  Neurological: She is alert and oriented to person, place, and time.  Skin: Skin is warm and dry.       Data Reviewed History reviewed   Assessment    5cm soft, mobile, subcutaneous mass on left lower back, consistent with lipoma. Considering size and location of the mass, may be more appropriate to have it excised in the OR. Discussed details of procedure with the patient and she was agreeable.    Plan    Patient to be scheduled for excision lipoma lower back.      HPI,  Physical Exam, Assessment and Plan have been scribed under the direction and in the presence of Mckinley Jewel, MD  Gaspar Cola, CMA  I have completed the exam and reviewed the above documentation for accuracy and completeness.  I agree with the above.  Haematologist has been used and any errors in dictation or transcription are unintentional.  Seeplaputhur G. Jamal Collin, M.D., F.A.C.S.  Junie Panning G 09/24/2016, 9:56 AM   Patient's surgery has been scheduled for 10-01-16 at [facility].   Dominga Ferry, CMA

## 2016-10-01 NOTE — Discharge Instructions (Signed)

## 2016-10-01 NOTE — Op Note (Signed)
Preop diagnosis: Soft tissue mass back  Post op diagnosis: Subcutaneous lipoma from back  Operation: Excision of lipoma low back area  Surgeon: Mckinley Jewel  Assistant:     Anesthesia: Gen.  Complications: None  EBL: Less than 10 mL  Drains: None  Description: Patient was put to sleep with an endotracheal tube and then positioned in the right lateral position held in place with a beanbag. The lipoma was located just to the left of the midline in the lumbar region. This area was prepped and draped as sterile field and timeout performed. Total of 20 mL of 0.5% Marcaine was instilled to obtain an adequate block. A transverse incision about 2-1/2 cm was made overlying the lipoma. Dissection was then continued to free up the lipoma that was approximately 6 x 4 cm and removed in its entirety. It did not involve the musculature underneath. Hemostasis obtained with cautery. Subcutaneous tissue closed with 2-0 Vicryl. Skin closed with subcuticular 4-0 Vicryl covered with Dermabond. Procedure was well-tolerated patient subsequently returned back extubated and returned recovery room stable condition.

## 2016-10-01 NOTE — Anesthesia Preprocedure Evaluation (Signed)
Anesthesia Evaluation  Patient identified by MRN, date of birth, ID band Patient awake    Reviewed: Allergy & Precautions, NPO status , Patient's Chart, lab work & pertinent test results  History of Anesthesia Complications Negative for: history of anesthetic complications  Airway Mallampati: I       Dental   Pulmonary neg pulmonary ROS,           Cardiovascular negative cardio ROS       Neuro/Psych negative neurological ROS     GI/Hepatic Neg liver ROS, GERD (only with spicy food, no recent problems)  ,  Endo/Other  negative endocrine ROS  Renal/GU negative Renal ROS     Musculoskeletal   Abdominal   Peds  Hematology negative hematology ROS (+)   Anesthesia Other Findings   Reproductive/Obstetrics                             Anesthesia Physical Anesthesia Plan  ASA: II  Anesthesia Plan: General   Post-op Pain Management:    Induction: Intravenous  Airway Management Planned: Oral ETT  Additional Equipment:   Intra-op Plan:   Post-operative Plan:   Informed Consent: I have reviewed the patients History and Physical, chart, labs and discussed the procedure including the risks, benefits and alternatives for the proposed anesthesia with the patient or authorized representative who has indicated his/her understanding and acceptance.     Plan Discussed with:   Anesthesia Plan Comments:         Anesthesia Quick Evaluation

## 2016-10-01 NOTE — Interval H&P Note (Signed)
History and Physical Interval Note:  10/01/2016 7:07 AM  Alicia Erickson  has presented today for surgery, with the diagnosis of lipoma back  The various methods of treatment have been discussed with the patient and family. After consideration of risks, benefits and other options for treatment, the patient has consented to  Procedure(s): EXCISION LIPOMA ON BACK (N/A) as a surgical intervention .  The patient's history has been reviewed, patient examined, no change in status, stable for surgery.  I have reviewed the patient's chart and labs.  Questions were answered to the patient's satisfaction.     SANKAR,SEEPLAPUTHUR G

## 2016-10-01 NOTE — Anesthesia Post-op Follow-up Note (Cosign Needed)
Anesthesia QCDR form completed.        

## 2016-10-01 NOTE — Anesthesia Procedure Notes (Signed)
Procedure Name: Intubation Date/Time: 10/01/2016 7:34 AM Performed by: Kennon Holter Pre-anesthesia Checklist: Patient identified, Patient being monitored, Timeout performed, Emergency Drugs available and Suction available Patient Re-evaluated:Patient Re-evaluated prior to inductionOxygen Delivery Method: Circle system utilized Preoxygenation: Pre-oxygenation with 100% oxygen Intubation Type: IV induction Ventilation: Mask ventilation without difficulty Laryngoscope Size: Miller and 2 Grade View: Grade I Tube type: Oral Tube size: 7.0 mm Number of attempts: 1 Airway Equipment and Method: Stylet Placement Confirmation: ETT inserted through vocal cords under direct vision,  positive ETCO2 and breath sounds checked- equal and bilateral Secured at: 21 cm Tube secured with: Tape Dental Injury: Teeth and Oropharynx as per pre-operative assessment

## 2016-10-01 NOTE — Transfer of Care (Signed)
Immediate Anesthesia Transfer of Care Note  Patient: Alicia Erickson  Procedure(s) Performed: Procedure(s): EXCISION LIPOMA ON BACK (Left)  Patient Location: PACU  Anesthesia Type:General  Level of Consciousness: drowsy and patient cooperative  Airway & Oxygen Therapy: Patient Spontanous Breathing and Patient connected to face mask oxygen  Post-op Assessment: Report given to RN and Post -op Vital signs reviewed and stable  Post vital signs: Reviewed and stable  Last Vitals:  Vitals:   10/01/16 0610 10/01/16 0817  BP: 116/74 117/82  Pulse: (!) 58 73  Resp: 12 15  Temp: 36.4 C 36.8 C    Last Pain:  Vitals:   10/01/16 0610  TempSrc: Tympanic         Past Medical History:  Diagnosis Date  . GERD (gastroesophageal reflux disease)    Past Surgical History:  Procedure Laterality Date  . TUBAL LIGATION    . WISDOM TOOTH EXTRACTION Left 09/28/2016   1 WISDOM TOOTH AND 1 MOLAR PULLED   No current facility-administered medications on file prior to encounter.    No current outpatient prescriptions on file prior to encounter.     Complications: No apparent anesthesia complications

## 2016-10-01 NOTE — Anesthesia Postprocedure Evaluation (Signed)
Anesthesia Post Note  Patient: Alicia Erickson  Procedure(s) Performed: Procedure(s) (LRB): EXCISION LIPOMA ON BACK (Left)  Patient location during evaluation: PACU Anesthesia Type: General Level of consciousness: awake and alert Pain management: pain level controlled Vital Signs Assessment: post-procedure vital signs reviewed and stable Respiratory status: spontaneous breathing and respiratory function stable Cardiovascular status: stable Anesthetic complications: no     Last Vitals:  Vitals:   10/01/16 0610 10/01/16 0817  BP: 116/74 117/82  Pulse: (!) 58 73  Resp: 12 15  Temp: 36.4 C 36.8 C    Last Pain:  Vitals:   10/01/16 0610  TempSrc: Tympanic                 KEPHART,WILLIAM K

## 2016-10-02 LAB — SURGICAL PATHOLOGY

## 2016-10-06 ENCOUNTER — Telehealth: Payer: Self-pay | Admitting: *Deleted

## 2016-10-06 NOTE — Telephone Encounter (Signed)
-----   Message from Christene Lye, MD sent at 10/05/2016  3:00 PM EDT ----- Infirm pt- benign lipoma

## 2016-10-07 NOTE — Telephone Encounter (Signed)
Notified patient as instructed, patient pleased. Discussed follow-up appointments, patient agrees  

## 2016-10-08 ENCOUNTER — Ambulatory Visit: Payer: 59 | Admitting: General Surgery

## 2016-10-08 ENCOUNTER — Encounter: Payer: Self-pay | Admitting: General Surgery

## 2016-10-08 ENCOUNTER — Ambulatory Visit (INDEPENDENT_AMBULATORY_CARE_PROVIDER_SITE_OTHER): Payer: 59 | Admitting: General Surgery

## 2016-10-08 VITALS — BP 100/60 | HR 97 | Resp 12 | Ht 60.0 in | Wt 115.0 lb

## 2016-10-08 DIAGNOSIS — D171 Benign lipomatous neoplasm of skin and subcutaneous tissue of trunk: Secondary | ICD-10-CM

## 2016-10-08 NOTE — Progress Notes (Signed)
Patient ID: Alicia Erickson, female   DOB: 06-06-1980, 36 y.o.   MRN: [id number]  Chief Complaint  Patient presents with  . Follow-up    HPI Alicia Erickson is a 36 y.o. female here today for her follow up lipoma excision done on 10/01/2016. Patient states she is doing well. HPI  Past Medical History:  Diagnosis Date  . GERD (gastroesophageal reflux disease)     Past Surgical History:  Procedure Laterality Date  . LIPOMA EXCISION Left 10/01/2016   Procedure: EXCISION LIPOMA ON BACK;  Surgeon: Christene Lye, Alicia Erickson;  Location: [facility] ORS;  Service: General;  Laterality: Left;  . TUBAL LIGATION    . WISDOM TOOTH EXTRACTION Left 09/28/2016   1 WISDOM TOOTH AND 1 MOLAR PULLED    No family history on file.  Social History Social History  Substance Use Topics  . Smoking status: Never Smoker  . Smokeless tobacco: Never Used  . Alcohol use No    No Known Allergies  Current Outpatient Prescriptions  Medication Sig Dispense Refill  . acetaminophen (TYLENOL) 325 MG tablet Take 650 mg by mouth every 6 (six) hours as needed (PT JUST HAD 2 TEETH PULLED 09-28-16).     Marland Kitchen ibuprofen (ADVIL,MOTRIN) 200 MG tablet Take 200 mg by mouth every 6 (six) hours as needed. PT JUST HAD 2 TEETH PULLED 09-28-16    . traMADol (ULTRAM) 50 MG tablet Take 1 tablet (50 mg total) by mouth every 6 (six) hours as needed. 20 tablet 0   No current facility-administered medications for this visit.     Review of Systems Review of Systems  Respiratory: Negative.   Cardiovascular: Negative.     Blood pressure 100/60, pulse 97, resp. rate 12, height 5' (1.524 m), weight 115 lb (52.2 kg).  Physical Exam Physical Exam  Constitutional: She appears well-developed and well-nourished.  Neurological: She is alert.  Skin: Skin is warm and dry.       Date   Prior notes and path  Reviewed-benign lipoma  Assessment    Post op excision large lipoma back. Doing well    Plan   Patient to return as  needed.    HPI, Physical Exam, Assessment and Plan have been scribed under the direction and in the presence of Alicia Jewel, Alicia Erickson  Alicia Erickson, Alicia Erickson  I have completed the exam and reviewed the above documentation for accuracy and completeness.  I agree with the above.  Haematologist has been used and any errors in dictation or transcription are unintentional.  Alicia Erickson, M.D., F.A.C.S.    Alicia Erickson 10/08/2016, 3:13 PM

## 2016-10-08 NOTE — Patient Instructions (Signed)
Patient to return as needed. 

## 2017-02-02 ENCOUNTER — Encounter: Payer: Self-pay | Admitting: Emergency Medicine

## 2017-02-02 ENCOUNTER — Emergency Department
Admission: EM | Admit: 2017-02-02 | Discharge: 2017-02-02 | Disposition: A | Discharge status: Home / Self Care | Payer: 59 | Attending: Emergency Medicine | Admitting: Emergency Medicine

## 2017-02-02 ENCOUNTER — Emergency Department: Payer: 59

## 2017-02-02 DIAGNOSIS — R091 Pleurisy: Secondary | ICD-10-CM

## 2017-02-02 DIAGNOSIS — J189 Pneumonia, unspecified organism: Secondary | ICD-10-CM

## 2017-02-02 DIAGNOSIS — J181 Lobar pneumonia, unspecified organism: Secondary | ICD-10-CM | POA: Insufficient documentation

## 2017-02-02 DIAGNOSIS — R079 Chest pain, unspecified: Secondary | ICD-10-CM | POA: Diagnosis present

## 2017-02-02 LAB — CBC
HCT: 35.8 % (ref 35.0–47.0)
Hemoglobin: 12.2 g/dL (ref 12.0–16.0)
MCH: 29.6 pg (ref 26.0–34.0)
MCHC: 34 g/dL (ref 32.0–36.0)
MCV: 87.2 fL (ref 80.0–100.0)
PLATELETS: 236 10*3/uL (ref 150–440)
RBC: 4.1 MIL/uL (ref 3.80–5.20)
RDW: 13.9 % (ref 11.5–14.5)
WBC: 8.7 10*3/uL (ref 3.6–11.0)

## 2017-02-02 LAB — BASIC METABOLIC PANEL
Anion gap: 6 (ref 5–15)
BUN: 14 mg/dL (ref 6–20)
CALCIUM: 9.4 mg/dL (ref 8.9–10.3)
CO2: 29 mmol/L (ref 22–32)
CREATININE: 0.94 mg/dL (ref 0.44–1.00)
Chloride: 105 mmol/L (ref 101–111)
Glucose, Bld: 85 mg/dL (ref 65–99)
Potassium: 3.4 mmol/L — ABNORMAL LOW (ref 3.5–5.1)
SODIUM: 140 mmol/L (ref 135–145)

## 2017-02-02 LAB — TROPONIN I

## 2017-02-02 MED ORDER — AZITHROMYCIN 250 MG PO TABS: ORAL_TABLET | ORAL | 0 refills | Status: AC

## 2017-02-02 MED ORDER — IBUPROFEN 600 MG PO TABS: 600.0000 mg | ORAL_TABLET | Freq: Three times a day (TID) | ORAL | 0 refills | Status: AC | PRN

## 2017-02-02 MED ORDER — IBUPROFEN 400 MG PO TABS
600.0000 mg | ORAL_TABLET | Freq: Once | ORAL | Status: AC
  Administered 2017-02-02: 600 mg via ORAL
  Filled 2017-02-02: qty 2

## 2017-02-02 NOTE — ED Notes (Signed)
Pt reports that she had a fever a couple weeks ago and states that last night she sat in a strained position for a long period of time and started to have chest pain as well as back pain, pt is tender to move and change positions. Pt also reports discomfort with breathing and light palpation to her chest. No distress noted other than discomfort with moving

## 2017-02-02 NOTE — Discharge Instructions (Signed)
Please take all of your antibiotics as prescribed and follow up with your primary care physician as needed. Return to the emergency department for any concerns.  It was a pleasure to take care of you today, and thank you for coming to our emergency department.  If you have any questions or concerns before leaving please ask the nurse to grab me and I'm more than happy to go through your aftercare instructions again.  If you were prescribed any opioid pain medication today such as Norco, Vicodin, Percocet, morphine, hydrocodone, or oxycodone please make sure you do not drive when you are taking this medication as it can alter your ability to drive safely.  If you have any concerns once you are home that you are not improving or are in fact getting worse before you can make it to your follow-up appointment, please do not hesitate to call 911 and come back for further evaluation.  Darel Hong, MD  Results for orders placed or performed during the hospital encounter of 67/61/95  Basic metabolic panel  Result Value Ref Range   Sodium 140 135 - 145 mmol/L   Potassium 3.4 (L) 3.5 - 5.1 mmol/L   Chloride 105 101 - 111 mmol/L   CO2 29 22 - 32 mmol/L   Glucose, Bld 85 65 - 99 mg/dL   BUN 14 6 - 20 mg/dL   Creatinine, Ser 0.94 0.44 - 1.00 mg/dL   Calcium 9.4 8.9 - 10.3 mg/dL   GFR calc non Af Amer >60 >60 mL/min   GFR calc Af Amer >60 >60 mL/min   Anion gap 6 5 - 15  CBC  Result Value Ref Range   WBC 8.7 3.6 - 11.0 K/uL   RBC 4.10 3.80 - 5.20 MIL/uL   Hemoglobin 12.2 12.0 - 16.0 g/dL   HCT 35.8 35.0 - 47.0 %   MCV 87.2 80.0 - 100.0 fL   MCH 29.6 26.0 - 34.0 pg   MCHC 34.0 32.0 - 36.0 g/dL   RDW 13.9 11.5 - 14.5 %   Platelets 236 150 - 440 K/uL  Troponin I  Result Value Ref Range   Troponin I <0.03 <0.03 ng/mL   Dg Chest 2 View  Result Date: 02/02/2017 CLINICAL DATA:  Chest pain. EXAM: CHEST  2 VIEW COMPARISON:  June 02, 2007 FINDINGS: The heart, hila, and mediastinum are normal.  Mild opacity in the right midlung not seen previously. Mild opacity in the lateral left lung base is favored to represent confluence of shadows and/or atelectasis. No other interval changes or acute abnormalities. IMPRESSION: 1. Opacity in the right mid lung is nonspecific. An early infiltrate could have this appearance. Recommend short-term follow-up to ensure resolution. 2. Opacity in the lateral left lung base is favored to represent confluence of shadows. Recommend attention on follow-up. Electronically Signed   By: Dorise Bullion III M.D   On: 02/02/2017 20:21

## 2017-02-02 NOTE — ED Triage Notes (Addendum)
Patient ambulatory to triage with steady gait, without difficulty or distress noted; pt reports since yesterday having left sided CP radiating into back with no accomp symptoms; denies hx of same; st pain increases with movement

## 2017-02-02 NOTE — ED Provider Notes (Signed)
[facility] Emergency Department Provider Note  ____________________________________________   First MD Initiated Contact with Patient 02/02/17 2141     (approximate)  I have reviewed the triage vital signs and the nursing notes.   HISTORY  Chief Complaint Chest Pain   HPI Alicia Erickson is a 36 y.o. female who comes to the emergency department with sharp right-sided chest pain that began yesterday. The pain is sharp intermittent. The symptoms are worse with movement and improved with rest. They're not pleuritic. She has no cough or shortness of breath. Roughly 2 weeks ago she did have a fever and cough that resolved on its own. She denies night sweats. She denies weight loss. She denies hemoptysis. The pain is not ripping or tearing and does not go straight to her back. She has no recent surgery or immobilization. She has no recent leg swelling.   Past Medical History:  Diagnosis Date  . GERD (gastroesophageal reflux disease)     There are no active problems to display for this patient.   Past Surgical History:  Procedure Laterality Date  . LIPOMA EXCISION Left 10/01/2016   Procedure: EXCISION LIPOMA ON BACK;  Surgeon: Christene Lye, MD;  Location: [facility] ORS;  Service: General;  Laterality: Left;  . TUBAL LIGATION    . WISDOM TOOTH EXTRACTION Left 09/28/2016   1 WISDOM TOOTH AND 1 MOLAR PULLED    Prior to Admission medications   Medication Sig Start Date End Date Taking? Authorizing Provider  acetaminophen (TYLENOL) 325 MG tablet Take 650 mg by mouth every 6 (six) hours as needed (PT JUST HAD 2 TEETH PULLED 09-28-16).     [provider]  azithromycin (ZITHROMAX Z-PAK) 250 MG tablet Take 2 tablets (500 mg) on  Day 1,  followed by 1 tablet (250 mg) once daily on Days 2 through 5. 02/02/17 02/07/17  Darel Hong, MD  ibuprofen (ADVIL,MOTRIN) 600 MG tablet Take 1 tablet (600 mg total) by mouth every 8 (eight) hours as needed.  02/02/17   Darel Hong, MD  traMADol (ULTRAM) 50 MG tablet Take 1 tablet (50 mg total) by mouth every 6 (six) hours as needed. 10/01/16 10/01/17  Christene Lye, MD    Allergies Patient has no known allergies.  No family history on file.  Social History Social History  Substance Use Topics  . Smoking status: Never Smoker  . Smokeless tobacco: Never Used  . Alcohol use No    Review of Systems Constitutional: No fever/chills Eyes: No visual changes. ENT: No sore throat. Cardiovascular: positive chest pain. Respiratory: positive shortness of breath. Gastrointestinal: No abdominal pain.  No nausea, no vomiting.  No diarrhea.  No constipation. Genitourinary: Negative for dysuria. Musculoskeletal: Negative for back pain. Skin: Negative for rash. Neurological: Negative for headaches, focal weakness or numbness.   ____________________________________________   PHYSICAL EXAM:  VITAL SIGNS: ED Triage Vitals [02/02/17 2002]  Enc Vitals Group     BP 125/82     Pulse Rate 86     Resp 18     Temp 98 F (36.7 C)     Temp Source Oral     SpO2 100 %     Weight 110 lb (49.9 kg)     Height 5' (1.524 m)     Head Circumference      Peak Flow      Pain Score      Pain Loc      Pain Edu?      Excl. in  GC?     Constitutional: alert and oriented 4 well appearing nontoxic no diaphoresis speaks full clear sentences Eyes: PERRL EOMI. Head: Atraumatic. Nose: No congestion/rhinnorhea. Mouth/Throat: No trismus Neck: No stridor.   Cardiovascular: Normal rate, regular rhythm. Grossly normal heart sounds.  Good peripheral circulation. Respiratory: Normal respiratory effort.  No retractions. crackles and mid lung fields on the right moving good air Gastrointestinal: soft nontender Musculoskeletal: No lower extremity edema   Neurologic:  Normal speech and language. No gross focal neurologic deficits are appreciated. Skin:  Skin is warm, dry and intact. No rash  noted. Psychiatric: Mood and affect are normal. Speech and behavior are normal.    ____________________________________________   DIFFERENTIAL includes but not limited to  pneumonia, pneumothorax, pleurisy, upper respiratory tract infection ____________________________________________   LABS (all labs ordered are listed, but only abnormal results are displayed)  Labs Reviewed  BASIC METABOLIC PANEL - Abnormal; Notable for the following:       Result Value   Potassium 3.4 (*)    All other components within normal limits  CBC  TROPONIN I    clinically insignificant mild hypokalemia __________________________________________  EKG  ED ECG REPORT I, Darel Hong, the attending physician, personally viewed and interpreted this ECG.  Date: 02/02/2017 EKG Time:  Rate: 75 Rhythm: normal sinus rhythm QRS Axis: normal Intervals: normal ST/T Wave abnormalities: normal Narrative Interpretation: no evidence of acute ischemia  ____________________________________________  RADIOLOGY  chest x-ray reviewed by me shows early infiltrate right middle lobe ____________________________________________   PROCEDURES  Procedure(s) performed: no  Procedures  Critical Care performed: no  Observation: no ____________________________________________   INITIAL IMPRESSION / ASSESSMENT AND PLAN / ED COURSE  Pertinent labs & imaging results that were available during my care of the patient were reviewed by me and considered in my medical decision making (see chart for details).  The patient's chest pain is quite atypical. Her chest x-ray is concerning for an early infiltrate however his symptoms are not entirely consistent with pneumonia. She did have pneumonialike symptoms roughly 2 weeks ago. She has no signs and symptoms of active tuberculosis. At this point I think it is reasonable to treat her with a macrolide for 5 days and refer her back to primary care. She is discharged  home in improved condition.      ____________________________________________   FINAL CLINICAL IMPRESSION(S) / ED DIAGNOSES  Final diagnoses:  Pleurisy  Community acquired pneumonia of right middle lobe of lung ([place])      NEW MEDICATIONS STARTED DURING THIS VISIT:  Discharge Medication List as of 02/02/2017  9:46 PM    START taking these medications   Details  azithromycin (ZITHROMAX Z-PAK) 250 MG tablet Take 2 tablets (500 mg) on  Day 1,  followed by 1 tablet (250 mg) once daily on Days 2 through 5., Print         Note:  This document was prepared using Dragon voice recognition software and may include unintentional dictation errors.     Darel Hong, MD 02/02/17 2350

## 2017-02-02 NOTE — ED Notes (Signed)
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
